# Patient Record
Sex: Female | Born: 1995 | Race: White | Hispanic: No | Marital: Married | Smoking: Never smoker
Health system: Southern US, Community
[De-identification: ages and names within clinical notes are randomized; demographics above are authoritative.]

## PROBLEM LIST (undated history)

## (undated) DIAGNOSIS — E039 Hypothyroidism, unspecified: Secondary | ICD-10-CM

## (undated) DIAGNOSIS — E119 Type 2 diabetes mellitus without complications: Secondary | ICD-10-CM

## (undated) DIAGNOSIS — E079 Disorder of thyroid, unspecified: Secondary | ICD-10-CM

## (undated) HISTORY — DX: Hypothyroidism, unspecified: E03.9

---

## 2019-12-12 ENCOUNTER — Emergency Department
Admission: EM | Admit: 2019-12-12 | Discharge: 2019-12-12 | Disposition: A | Discharge status: Home / Self Care | Payer: Federal, State, Local not specified - PPO | Attending: Emergency Medicine | Admitting: Emergency Medicine

## 2019-12-12 ENCOUNTER — Emergency Department: Payer: Federal, State, Local not specified - PPO

## 2019-12-12 ENCOUNTER — Other Ambulatory Visit: Payer: Self-pay

## 2019-12-12 DIAGNOSIS — O26899 Other specified pregnancy related conditions, unspecified trimester: Secondary | ICD-10-CM

## 2019-12-12 DIAGNOSIS — Z3A08 8 weeks gestation of pregnancy: Secondary | ICD-10-CM | POA: Insufficient documentation

## 2019-12-12 DIAGNOSIS — R109 Unspecified abdominal pain: Secondary | ICD-10-CM

## 2019-12-12 DIAGNOSIS — O039 Complete or unspecified spontaneous abortion without complication: Secondary | ICD-10-CM | POA: Diagnosis not present

## 2019-12-12 DIAGNOSIS — O24111 Pre-existing diabetes mellitus, type 2, in pregnancy, first trimester: Secondary | ICD-10-CM | POA: Insufficient documentation

## 2019-12-12 DIAGNOSIS — O4691 Antepartum hemorrhage, unspecified, first trimester: Secondary | ICD-10-CM | POA: Diagnosis present

## 2019-12-12 DIAGNOSIS — O209 Hemorrhage in early pregnancy, unspecified: Secondary | ICD-10-CM

## 2019-12-12 HISTORY — DX: Disorder of thyroid, unspecified: E07.9

## 2019-12-12 HISTORY — DX: Type 2 diabetes mellitus without complications: E11.9

## 2019-12-12 LAB — CBC
HCT: 38.2 % (ref 36.0–46.0)
Hemoglobin: 12.8 g/dL (ref 12.0–15.0)
MCH: 27 pg (ref 26.0–34.0)
MCHC: 33.5 g/dL (ref 30.0–36.0)
MCV: 80.6 fL (ref 80.0–100.0)
Platelets: 279 10*3/uL (ref 150–400)
RBC: 4.74 MIL/uL (ref 3.87–5.11)
RDW: 12.7 % (ref 11.5–15.5)
WBC: 10.7 10*3/uL — ABNORMAL HIGH (ref 4.0–10.5)
nRBC: 0 % (ref 0.0–0.2)

## 2019-12-12 LAB — BASIC METABOLIC PANEL
Anion gap: 14 (ref 5–15)
BUN: 12 mg/dL (ref 6–20)
CO2: 18 mmol/L — ABNORMAL LOW (ref 22–32)
Calcium: 9.1 mg/dL (ref 8.9–10.3)
Chloride: 104 mmol/L (ref 98–111)
Creatinine, Ser: 0.6 mg/dL (ref 0.44–1.00)
GFR calc Af Amer: >60 mL/min (ref >60)
GFR calc non Af Amer: >60 mL/min (ref >60)
Glucose, Bld: 187 mg/dL — ABNORMAL HIGH (ref 70–99)
Potassium: 3.8 mmol/L (ref 3.5–5.1)
Sodium: 136 mmol/L (ref 135–145)

## 2019-12-12 LAB — HCG, QUANTITATIVE, PREGNANCY: hCG, Beta Chain, Quant, S: 1164 m[IU]/mL — ABNORMAL HIGH (ref <5)

## 2019-12-12 MED ORDER — ONDANSETRON HCL 4 MG/2ML IJ SOLN
4.0000 mg | Freq: Once | INTRAMUSCULAR | Status: AC
  Administered 2019-12-12: 08:00:00 4 mg via INTRAVENOUS
  Filled 2019-12-12: qty 2

## 2019-12-12 MED ORDER — HYDROMORPHONE HCL 1 MG/ML IJ SOLN
1.0000 mg | Freq: Once | INTRAMUSCULAR | Status: AC
  Administered 2019-12-12: 0.5 mg via INTRAVENOUS
  Filled 2019-12-12: qty 1

## 2019-12-12 MED ORDER — FENTANYL CITRATE (PF) 100 MCG/2ML IJ SOLN
50.0000 ug | INTRAMUSCULAR | Status: AC | PRN
  Administered 2019-12-12 (×2): 50 ug via INTRAVENOUS
  Filled 2019-12-12 (×2): qty 2

## 2019-12-12 NOTE — ED Triage Notes (Signed)
FIRST NURSE NOTE:  Pt reports having miscarriage, states she is having a lot of pain, pt given mask on arrival, but won't put on.

## 2019-12-12 NOTE — ED Notes (Signed)
States pain comes in waves. Pt appears comfortable when not experiencing "waves" of pain. Denies more PRN pain medication at this time. Updated on plan of care and awaiting bed and EDP assessment. Wheeled to bathroom and provided more overnight pads for changing.

## 2019-12-12 NOTE — ED Triage Notes (Signed)
Pt reports confirmed miscarriage, approx 9 weeks. Began having constant bleeding this AM, increased pain. Pt took aleeve and tylenol this AM. "waves" of pain.

## 2019-12-12 NOTE — ED Provider Notes (Signed)
[facility] Emergency Department Provider Note  Time seen: 10:40 AM  I have reviewed the triage vital signs and the nursing notes.   HISTORY  Chief Complaint Miscarriage   HPI Caitlyn Garza is a 24 y.o. female with a past medical history of diabetes presents to the emergency department for a miscarriage in progress.  According to the patient approximately 5 days ago she was seen at [facility] emergency department and diagnosed with a miscarriage with an 8-week fetus without a heartbeat.  Patient was given options as far as surgical or expectant management, chose expectant management.  Patient states she began having some spotting bleeding around that time as well 5 days ago however this morning the bleeding increased and her cramping increased in intensity.  Patient states moderate lower abdominal pain/cramping.  Patient states she did not know it was going to be so uncomfortable/painful so she presented back to the emergency department.  Denies any lightheadedness dizziness.  Largely negative review of systems.   Past Medical History:  Diagnosis Date  . Diabetes mellitus without complication (HCC)   . Thyroid disease     There are no problems to display for this patient.   History reviewed. No pertinent surgical history.  Prior to Admission medications   Not on File    Allergies  Allergen Reactions  . Codeine Nausea And Vomiting  . Penicillins Nausea And Vomiting  . Amoxicillin     No family history on file.  Social History Social History   Tobacco Use  . Smoking status: Not on file  Substance Use Topics  . Alcohol use: Not Currently  . Drug use: Not on file    Review of Systems Constitutional: Negative for fever Cardiovascular: Negative for chest pain. Respiratory: Negative for shortness of breath. Gastrointestinal: Lower abdominal pain/cramping. Genitourinary: Positive for vaginal bleeding. Musculoskeletal: Negative for  musculoskeletal complaints Neurological: Negative for headache All other ROS negative  ____________________________________________   PHYSICAL EXAM:  VITAL SIGNS: ED Triage Vitals [12/12/19 0743]  Enc Vitals Group     BP 131/81     Pulse Rate 90     Resp 18     Temp 98 F (36.7 C)     Temp Source Oral     SpO2 100 %     Weight 250 lb (113.4 kg)     Height 5\' 6"  (1.676 m)     Head Circumference      Peak Flow      Pain Score 7     Pain Loc      Pain Edu?      Excl. in GC?    Constitutional: Alert and oriented. Well appearing and in no distress. Eyes: Normal exam ENT      Head: Normocephalic and atraumatic.      Mouth/Throat: Mucous membranes are moist. Cardiovascular: Normal rate, regular rhythm.  Respiratory: Normal respiratory effort without tachypnea nor retractions. Breath sounds are clear Gastrointestinal: Soft, mild suprapubic tenderness palpation without rebound guarding or distention. Musculoskeletal: Nontender with normal range of motion in all extremities.  Neurologic:  Normal speech and language. No gross focal neurologic deficits  Skin:  Skin is warm, dry and intact.  Psychiatric: Mood and affect are normal.   ____________________________________________    RADIOLOGY  Ultrasound shows no findings within the uterus.   ____________________________________________   INITIAL IMPRESSION / ASSESSMENT AND PLAN / ED COURSE  Pertinent labs & imaging results that were available during my care of the patient were reviewed  by me and considered in my medical decision making (see chart for details).   Patient presents emergency department for lower abdominal discomfort vaginal bleeding with a likely miscarriage in progress.  We will dose pain medication, lab work is largely nonrevealing largely unchanged H&H.  We will obtain an ultrasound to further evaluate.  Patient agreeable to plan of care.  Ultrasound shows essentially an empty uterus.  Patient states just  prior to the ultrasound she urinated and felt something come out of her into the toilet.  Patient states immediately after that she felt significant relief.  States her pain has been mild ever since then consistent with more menstrual cramping.  Patient is ultrasound appears to show a completed miscarriage.  I discussed with the patient return precautions for any return of significant pain or significant bleeding.  Also discussed taking a pregnancy test in 7 days to ensure that it is negative and following up with her OB.  Patient agreeable to plan of care.   Caitlyn Garza was evaluated in Emergency Department on 12/12/2019 for the symptoms described in the history of present illness. She was evaluated in the context of the global COVID-19 pandemic, which necessitated consideration that the patient might be at risk for infection with the SARS-CoV-2 virus that causes COVID-19. Institutional protocols and algorithms that pertain to the evaluation of patients at risk for COVID-19 are in a state of rapid change based on information released by regulatory bodies including the CDC and federal and state organizations. These policies and algorithms were followed during the patient's care in the ED.  ____________________________________________   FINAL CLINICAL IMPRESSION(S) / ED DIAGNOSES  Miscarriage   Harvest Dark, MD 12/12/19 1219

## 2020-03-17 IMAGING — US US OB < 14 WEEKS - US OB TV
1 series · 13 of 28 positions shown · non-contrast
Comparison: None.

CLINICAL DATA: Vaginal bleeding and cramping for 6 days, beta HCG
is elevated and last menstrual period of 06/14/2020 would give
gestational age by LMP of 13 weeks 4 days.

EXAM:
OBSTETRIC <14 WK US AND TRANSVAGINAL OB US
TECHNIQUE: Both transabdominal and transvaginal ultrasound examinations were
performed for complete evaluation of the gestation as well as the
maternal uterus, adnexal regions, and pelvic cul-de-sac.
Transvaginal technique was performed to assess early pregnancy.

[Series 1: us ob < 14 weeks - us ob tv · 13 of 133 slices shown]
[im 5/133]
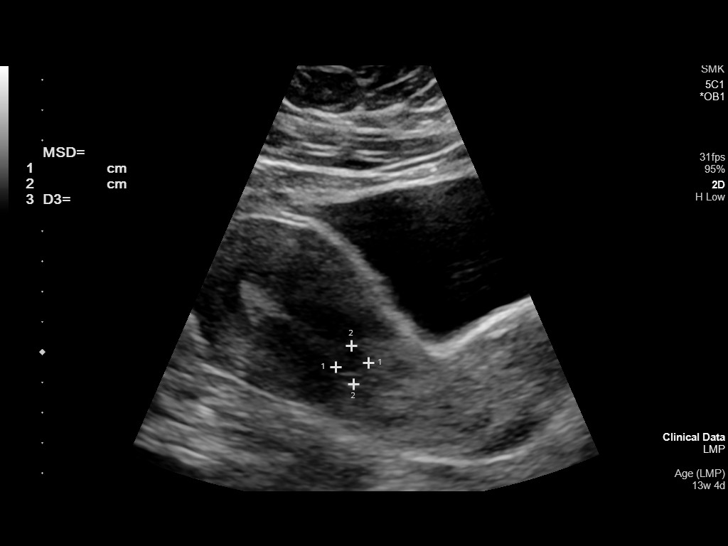
[im 15/133]
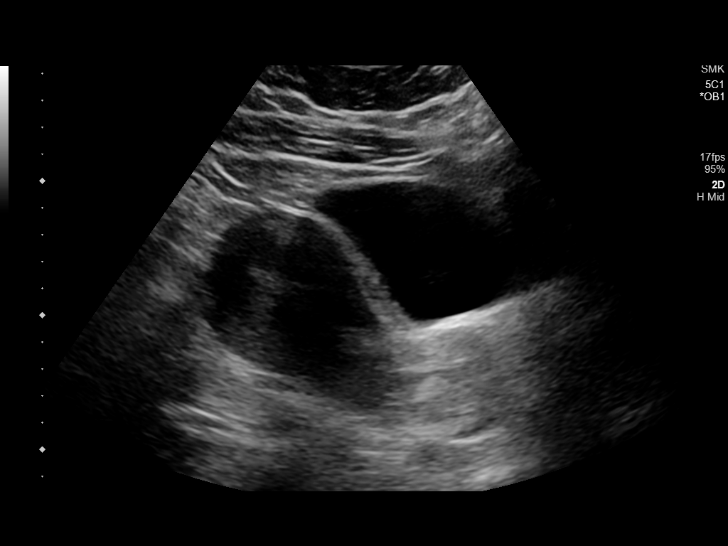
[im 25/133]
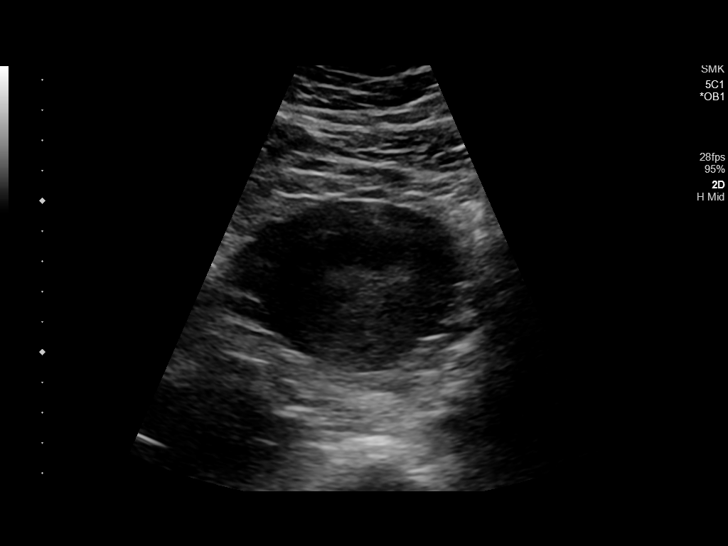
[im 35/133]
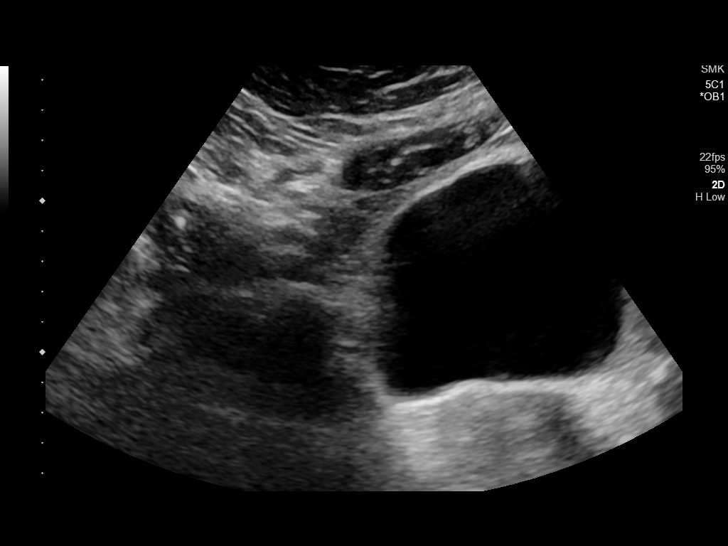
[im 45/133]
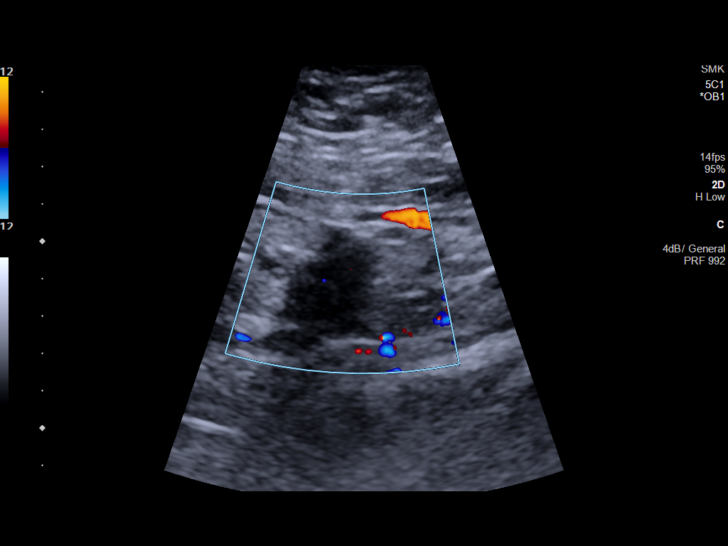
[im 54/133]
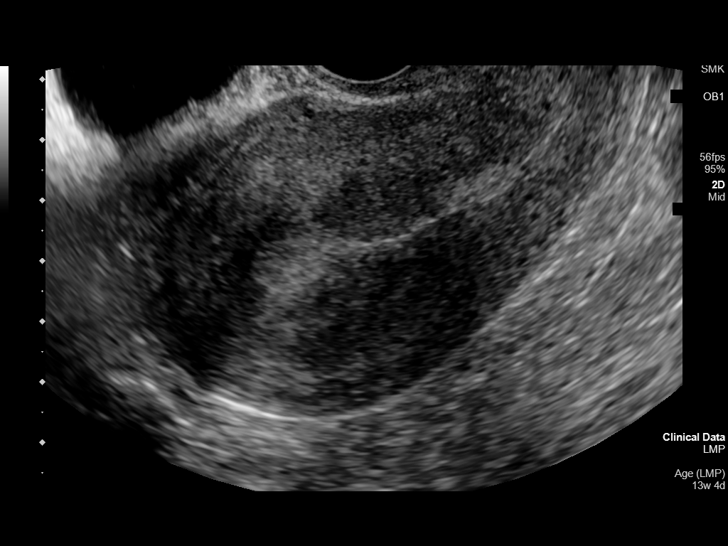
[im 69/133]
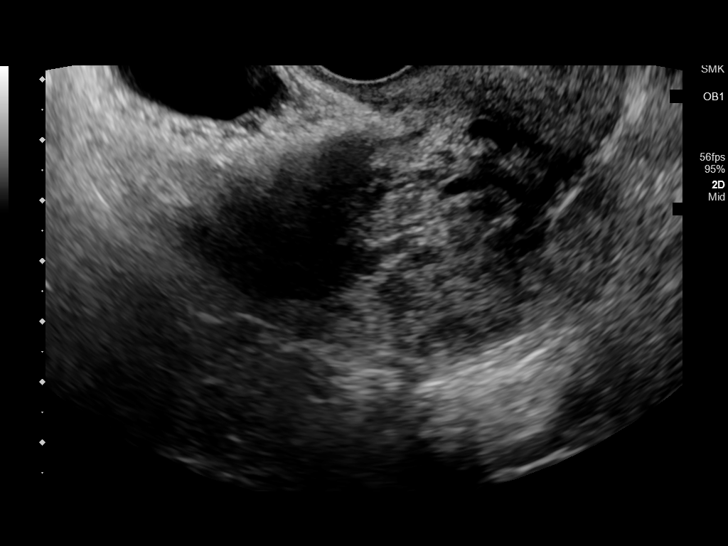
[im 79/133]
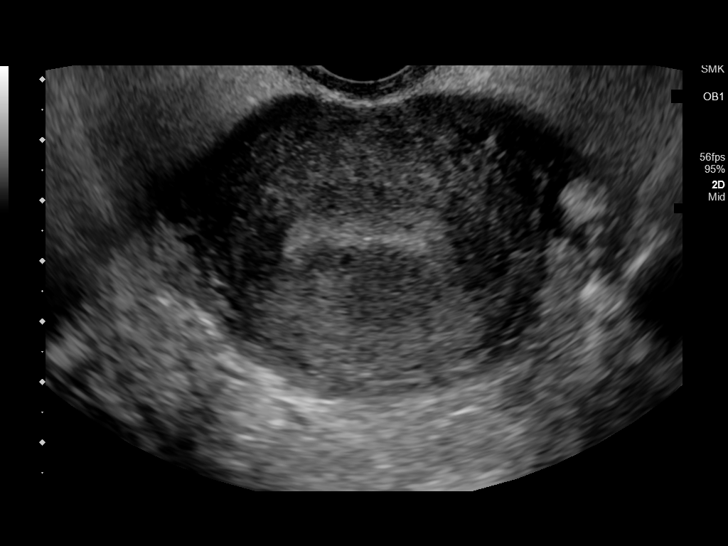
[im 89/133]
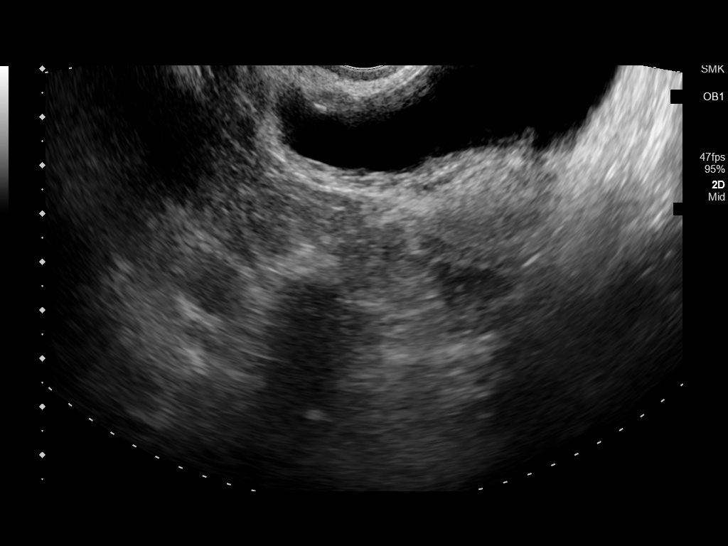
[im 98/133]
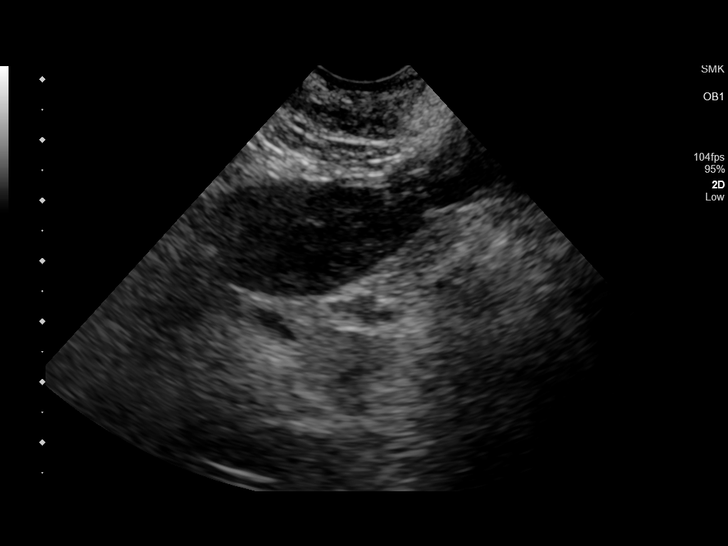
[im 108/133]
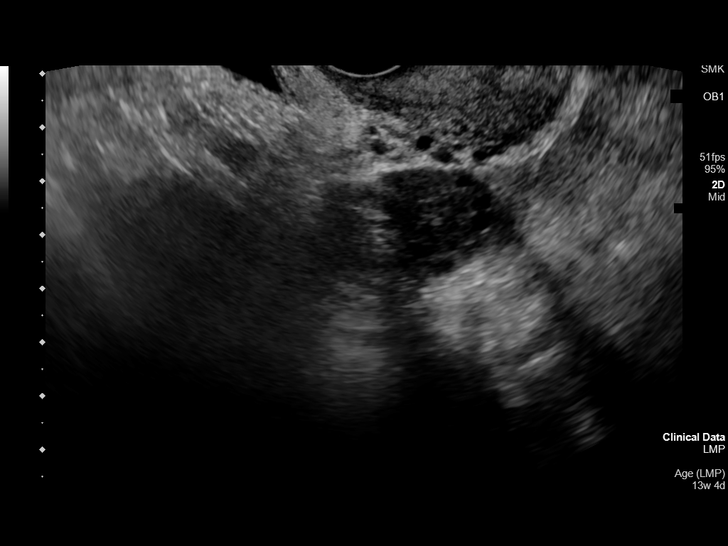
[im 118/133]
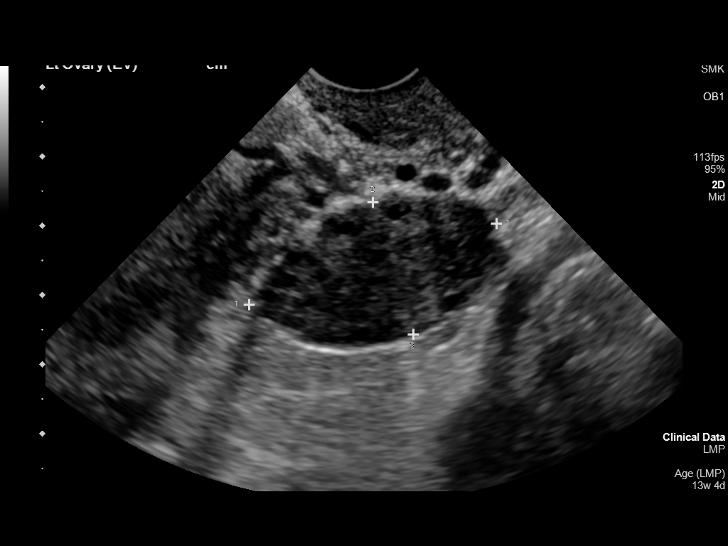
[im 128/133]
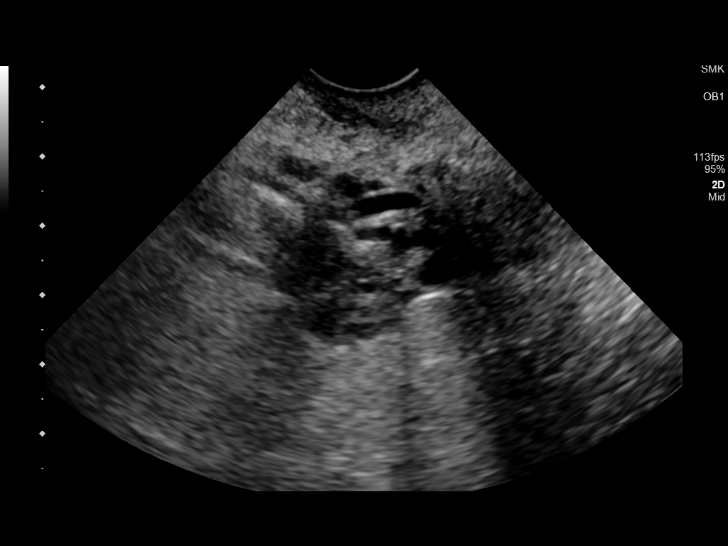

[13 of 28 positions shown; findings below may reference images not displayed]

FINDINGS: Intrauterine gestational sac: Questionable area visualized on
transabdominal imaging, none seen on endovaginal portion of the
evaluation in the area of concern noted on transabdominal images.
According to the sonographer the patient reported passing material
when going to empty bladder before endovaginal ultrasound. This area
measured approximately 11.5 mm on the transabdominal portion but is
again not seen on the endovaginal portion of the exam.

Yolk sac:  Not visualized

Embryo:  Not visualized

Maternal uterus/adnexae: Findings above with discordant findings of
subtle area on transabdominal without correlate on endovaginal
sonogram within the lower uterine segment. Thickening of the
endometrium.

Normal appearance of bilateral ovaries.

Small free fluid with simple appearance in the cul-de-sac.
IMPRESSION: Pregnancy of unknown anatomic location (no intrauterine gestational
sac or adnexal mass identified). Differential diagnosis includes
recent spontaneous miscarriage, IUP too early to visualize, and
non-visualized ectopic pregnancy. Recommend correlation with serial
beta-hCG levels, and follow up US.

See above for questionable passage of material during the
examination. Despite this would still recommend serial beta hCGs and
follow-up ultrasound for further assessment.

## 2020-07-20 ENCOUNTER — Emergency Department (HOSPITAL_COMMUNITY): Payer: Federal, State, Local not specified - PPO

## 2020-07-20 ENCOUNTER — Encounter (HOSPITAL_COMMUNITY): Payer: Self-pay | Admitting: Emergency Medicine

## 2020-07-20 ENCOUNTER — Emergency Department (HOSPITAL_COMMUNITY)
Admission: EM | Admit: 2020-07-20 | Discharge: 2020-07-20 | Disposition: A | Discharge status: Home / Self Care | Payer: Federal, State, Local not specified - PPO | Attending: Emergency Medicine | Admitting: Emergency Medicine

## 2020-07-20 ENCOUNTER — Other Ambulatory Visit: Payer: Self-pay

## 2020-07-20 DIAGNOSIS — Z7982 Long term (current) use of aspirin: Secondary | ICD-10-CM | POA: Insufficient documentation

## 2020-07-20 DIAGNOSIS — R000 Tachycardia, unspecified: Secondary | ICD-10-CM | POA: Insufficient documentation

## 2020-07-20 DIAGNOSIS — E1165 Type 2 diabetes mellitus with hyperglycemia: Secondary | ICD-10-CM | POA: Diagnosis not present

## 2020-07-20 DIAGNOSIS — Z79899 Other long term (current) drug therapy: Secondary | ICD-10-CM | POA: Diagnosis not present

## 2020-07-20 DIAGNOSIS — Z20822 Contact with and (suspected) exposure to covid-19: Secondary | ICD-10-CM | POA: Diagnosis not present

## 2020-07-20 DIAGNOSIS — R079 Chest pain, unspecified: Secondary | ICD-10-CM | POA: Insufficient documentation

## 2020-07-20 DIAGNOSIS — R059 Cough, unspecified: Secondary | ICD-10-CM | POA: Insufficient documentation

## 2020-07-20 DIAGNOSIS — J0100 Acute maxillary sinusitis, unspecified: Secondary | ICD-10-CM | POA: Diagnosis not present

## 2020-07-20 DIAGNOSIS — Z7984 Long term (current) use of oral hypoglycemic drugs: Secondary | ICD-10-CM | POA: Diagnosis not present

## 2020-07-20 DIAGNOSIS — E039 Hypothyroidism, unspecified: Secondary | ICD-10-CM | POA: Insufficient documentation

## 2020-07-20 DIAGNOSIS — R42 Dizziness and giddiness: Secondary | ICD-10-CM | POA: Diagnosis not present

## 2020-07-20 DIAGNOSIS — Z794 Long term (current) use of insulin: Secondary | ICD-10-CM | POA: Insufficient documentation

## 2020-07-20 DIAGNOSIS — R739 Hyperglycemia, unspecified: Secondary | ICD-10-CM

## 2020-07-20 LAB — I-STAT VENOUS BLOOD GAS, ED
Acid-Base Excess: 0 mmol/L (ref 0.0–2.0)
Bicarbonate: 26.1 mmol/L (ref 20.0–28.0)
Calcium, Ion: 1.16 mmol/L (ref 1.15–1.40)
HCT: 40 % (ref 36.0–46.0)
Hemoglobin: 13.6 g/dL (ref 12.0–15.0)
O2 Saturation: 98 %
Potassium: 4 mmol/L (ref 3.5–5.1)
Sodium: 136 mmol/L (ref 135–145)
TCO2: 28 mmol/L (ref 22–32)
pCO2, Ven: 48.3 mmHg (ref 44.0–60.0)
pH, Ven: 7.34 (ref 7.250–7.430)
pO2, Ven: 106 mmHg — ABNORMAL HIGH (ref 32.0–45.0)

## 2020-07-20 LAB — BASIC METABOLIC PANEL
Anion gap: 16 — ABNORMAL HIGH (ref 5–15)
BUN: 16 mg/dL (ref 6–20)
CO2: 22 mmol/L (ref 22–32)
Calcium: 9.7 mg/dL (ref 8.9–10.3)
Chloride: 97 mmol/L — ABNORMAL LOW (ref 98–111)
Creatinine, Ser: 0.81 mg/dL (ref 0.44–1.00)
GFR calc non Af Amer: >60 mL/min (ref >60)
Glucose, Bld: 534 mg/dL (ref 70–99)
Potassium: 4.1 mmol/L (ref 3.5–5.1)
Sodium: 135 mmol/L (ref 135–145)

## 2020-07-20 LAB — CBC
HCT: 46.3 % — ABNORMAL HIGH (ref 36.0–46.0)
Hemoglobin: 15.1 g/dL — ABNORMAL HIGH (ref 12.0–15.0)
MCH: 25.9 pg — ABNORMAL LOW (ref 26.0–34.0)
MCHC: 32.6 g/dL (ref 30.0–36.0)
MCV: 79.6 fL — ABNORMAL LOW (ref 80.0–100.0)
Platelets: 369 10*3/uL (ref 150–400)
RBC: 5.82 MIL/uL — ABNORMAL HIGH (ref 3.87–5.11)
RDW: 12.6 % (ref 11.5–15.5)
WBC: 8.9 10*3/uL (ref 4.0–10.5)
nRBC: 0 % (ref 0.0–0.2)

## 2020-07-20 LAB — I-STAT BETA HCG BLOOD, ED (MC, WL, AP ONLY): I-stat hCG, quantitative: <5 m[IU]/mL (ref <5)

## 2020-07-20 LAB — URINALYSIS, ROUTINE W REFLEX MICROSCOPIC
Bilirubin Urine: NEGATIVE
Glucose, UA: >=500 mg/dL — AB
Hgb urine dipstick: NEGATIVE
Ketones, ur: NEGATIVE mg/dL
Leukocytes,Ua: NEGATIVE
Nitrite: NEGATIVE
Protein, ur: NEGATIVE mg/dL
Specific Gravity, Urine: 1.04 — ABNORMAL HIGH (ref 1.005–1.030)
pH: 5 (ref 5.0–8.0)

## 2020-07-20 LAB — CBG MONITORING, ED
Glucose-Capillary: 547 mg/dL (ref 70–99)
Glucose-Capillary: 448 mg/dL — ABNORMAL HIGH (ref 70–99)
Glucose-Capillary: 380 mg/dL — ABNORMAL HIGH (ref 70–99)
Glucose-Capillary: 278 mg/dL — ABNORMAL HIGH (ref 70–99)

## 2020-07-20 LAB — RESPIRATORY PANEL BY RT PCR (FLU A&B, COVID)
Influenza A by PCR: NEGATIVE
Influenza B by PCR: NEGATIVE
SARS Coronavirus 2 by RT PCR: NEGATIVE

## 2020-07-20 LAB — D-DIMER, QUANTITATIVE: D-Dimer, Quant: 0.53 ug/mL-FEU — ABNORMAL HIGH (ref 0.00–0.50)

## 2020-07-20 LAB — TROPONIN I (HIGH SENSITIVITY)
Troponin I (High Sensitivity): 3 ng/L (ref <18)
Troponin I (High Sensitivity): 5 ng/L (ref <18)

## 2020-07-20 MED ORDER — INSULIN ASPART 100 UNIT/ML ~~LOC~~ SOLN
10.0000 [IU] | Freq: Once | SUBCUTANEOUS | Status: AC
  Administered 2020-07-20: 10 [IU] via SUBCUTANEOUS

## 2020-07-20 MED ORDER — KETOROLAC TROMETHAMINE 30 MG/ML IJ SOLN
30.0000 mg | Freq: Once | INTRAMUSCULAR | Status: AC
  Administered 2020-07-20: 30 mg via INTRAVENOUS
  Filled 2020-07-20: qty 1

## 2020-07-20 MED ORDER — INSULIN ASPART 100 UNIT/ML ~~LOC~~ SOLN
15.0000 [IU] | Freq: Once | SUBCUTANEOUS | Status: AC
  Administered 2020-07-20: 15 [IU] via SUBCUTANEOUS

## 2020-07-20 MED ORDER — SODIUM CHLORIDE 0.9 % IV BOLUS
2000.0000 mL | Freq: Once | INTRAVENOUS | Status: AC
  Administered 2020-07-20: 2000 mL via INTRAVENOUS

## 2020-07-20 MED ORDER — IOHEXOL 350 MG/ML SOLN
100.0000 mL | Freq: Once | INTRAVENOUS | Status: AC | PRN
  Administered 2020-07-20: 62 mL via INTRAVENOUS

## 2020-07-20 MED ORDER — LORAZEPAM 2 MG/ML IJ SOLN
1.0000 mg | Freq: Once | INTRAMUSCULAR | Status: AC
  Administered 2020-07-20: 1 mg via INTRAVENOUS
  Filled 2020-07-20: qty 1

## 2020-07-20 MED ORDER — AZITHROMYCIN 250 MG PO TABS: 250.0000 mg | ORAL_TABLET | Freq: Every day | ORAL | 0 refills | Status: DC

## 2020-07-20 MED ORDER — ONDANSETRON 4 MG PO TBDP: 4.0000 mg | ORAL_TABLET | Freq: Three times a day (TID) | ORAL | 0 refills | Status: DC | PRN

## 2020-07-20 NOTE — ED Notes (Signed)
Pt transported to CT ?

## 2020-07-20 NOTE — ED Triage Notes (Addendum)
Pt arrives to ED with complaints of palpitations, dizziness, and elevated blood sugars starting this morning while at work. Pt states her HR got up to 180 and blood sugar was 475. Pt states shes been on methylprednisolone for about a week for a sinus infection. Pt states she got hot, dizzy, and nauseous. Hx diabetes and hypothyroidism.

## 2020-07-20 NOTE — ED Provider Notes (Addendum)
[facility] Provider Note   CSN: [id number] Arrival date & time: 07/20/20  [id number]    History Chief Complaint  Patient presents with  . Hyperglycemia  . Tachycardia    Caitlyn Billy FischerShetley is a 24 y.o. female with past medical history significant for diabetes, hypothyroidism who presents for evaluation of not feeling well.  Patient recently diagnosed with diabetes 3 years ago at the age of 24.  Was not being followed by PCP up until a few months ago.  Patient states her last A1c was 11.  Patient is on insulin as well as Metformin.  Has not been checking her blood sugars.  Did not take her insulin today.  Did take yesterday evening.  Patient states she has been having palpitations.  These were sudden onset.  Associated chest tightness. No prior history of PE, DVT, On BC.  No unilateral leg swelling, redness around her has bilateral lower extremity cramping.  She states her PCP told her she was not sure if she was a type II or a type I diabetic.  Patient did take 2 days of Depo-Medrol due to rhinorrhea and sinus congestion which she started on Monday.  Started feeling unwell after taking 2 days worth so she stopped taking the steroids.  Has had some cough.  States she had a negative PCR test for Covid on Monday.  She is vaccinated.  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  Will occasionally have some burning with urination however no hematuria.  LMP in September.  She is sexually active with her husband.  Denies vaginal discharge, pelvic pain or concerns for STDs.  No associated abdominal pain.  Denies current chest pain however states it feels like her heart is racing.  Has had some lightheadedness and dizziness.  No sudden onset thunderclap headache.  No double vision, visual field cuts.  No neck stiffness or neck rigidity.  Denies additional aggravating or alleviating factors.  History obtained from patient  and past medical records. No interpretor was used.  HPI     Past Medical History:  Diagnosis Date  . Diabetes mellitus without complication (HCC)   . Thyroid disease     There are no problems to display for this patient.   History reviewed. No pertinent surgical history.   OB History    Gravida  1   Para      Term      Preterm      AB      Living        SAB      TAB      Ectopic      Multiple      Live Births              History reviewed. No pertinent family history.  Social History   Tobacco Use  . Smoking status: Not on file  Substance Use Topics  . Alcohol use: Not Currently  . Drug use: Not on file    Home Medications Prior to Admission medications   Medication Sig Start Date End Date Taking? Authorizing Provider  aspirin EC 81 MG tablet Take 81 mg by mouth once. Swallow whole.   Yes [provider]  insulin aspart (NOVOLOG) 100 UNIT/ML FlexPen Inject 30 Units into the skin 3 (three) times daily with meals. 05/25/20  Yes [provider]  insulin degludec (TRESIBA FLEXTOUCH) 200 UNIT/ML FlexTouch Pen  Inject 100 Units into the skin daily. 05/25/20  Yes [provider]  levothyroxine (SYNTHROID) 150 MCG tablet Take 150 mcg by mouth daily. 07/06/20  Yes [provider]  metFORMIN (GLUCOPHAGE) 500 MG tablet Take 500 mg by mouth 2 (two) times daily with a meal.  05/31/20 11/27/20 Yes [provider]  pantoprazole (PROTONIX) 40 MG tablet Take 1 tablet by mouth daily as needed for heartburn. 07/06/20  Yes [provider]  azithromycin (ZITHROMAX) 250 MG tablet Take 1 tablet (250 mg total) by mouth daily. Take first 2 tablets together, then 1 every day until finished. 07/20/20   Carmel Waddington A, PA-C  ondansetron (ZOFRAN ODT) 4 MG disintegrating tablet Take 1 tablet (4 mg total) by mouth every 8 (eight) hours as needed for nausea or vomiting. 07/20/20   Lleyton Byers A, PA-C    Allergies    Codeine,  Penicillins, Amoxicillin, Hydromorphone, and Morphine  Review of Systems   Review of Systems  Constitutional: Positive for activity change, appetite change and fatigue. Negative for chills, diaphoresis, fever and unexpected weight change.  HENT: Positive for congestion, postnasal drip and rhinorrhea. Negative for sinus pressure, sinus pain, sore throat, trouble swallowing and voice change.   Respiratory: Positive for cough and shortness of breath. Negative for apnea, choking, chest tightness, wheezing and stridor.   Cardiovascular: Positive for palpitations. Negative for chest pain and leg swelling.  Gastrointestinal: Negative.   Genitourinary: Positive for dysuria. Negative for decreased urine volume, difficulty urinating, dyspareunia, flank pain, frequency, genital sores, hematuria, menstrual problem, pelvic pain, urgency, vaginal bleeding, vaginal discharge and vaginal pain.  Musculoskeletal: Negative.   Skin: Negative.   Neurological: Positive for weakness (Generalized) and light-headedness. Negative for dizziness, tremors, seizures, syncope, facial asymmetry, speech difficulty, numbness and headaches.  All other systems reviewed and are negative.   Physical Exam Updated Vital Signs BP 117/82   Pulse 74   Temp 98.7 F (37.1 C) (Oral)   Resp 20   Ht 5\' 5"  (1.651 m)   Wt 111.1 kg   LMP 09/08/2019 Comment: NEG HCG 07/20/20  SpO2 98%   BMI 40.77 kg/m   Physical Exam Vitals and nursing note reviewed.  Constitutional:      General: She is not in acute distress.    Appearance: She is well-developed. She is obese. She is not ill-appearing or toxic-appearing.  HENT:     Head: Normocephalic and atraumatic.     Nose: Congestion and rhinorrhea present.     Mouth/Throat:     Mouth: Mucous membranes are moist.  Eyes:     Pupils: Pupils are equal, round, and reactive to light.  Cardiovascular:     Rate and Rhythm: Tachycardia present.     Pulses: Normal pulses.     Heart sounds:  Normal heart sounds.  Pulmonary:     Effort: Pulmonary effort is normal. No respiratory distress.     Breath sounds: Normal breath sounds.     Comments: Speaks in full sentences without difficulty. Lungs clear to auscultation bilaterally. Abdominal:     General: Bowel sounds are normal. There is no distension.     Tenderness: There is no abdominal tenderness. There is no right CVA tenderness, left CVA tenderness or guarding.     Comments: Soft non tender without rebound or guarding  Musculoskeletal:        General: No swelling, tenderness, deformity or signs of injury. Normal range of motion.     Cervical back: Normal range of motion.  Right lower leg: No edema.     Left lower leg: No edema.     Comments: Moves all 4 extremities without difficulty. No bony tenderness. Compartment soft. Homans sign negative  Skin:    General: Skin is warm and dry.     Capillary Refill: Capillary refill takes less than 2 seconds.     Comments: No edema, erythema, warmth.  Neurological:     General: No focal deficit present.     Mental Status: She is alert and oriented to person, place, and time.     Comments: CN 2-12 grossly intact. Ambulatory without difficulty  Psychiatric:     Comments: Appear anxious    ED Results / Procedures / Treatments   Labs (all labs ordered are listed, but only abnormal results are displayed) Labs Reviewed  BASIC METABOLIC PANEL - Abnormal; Notable for the following components:      Result Value   Chloride 97 (*)    Glucose, Bld 534 (*)    Anion gap 16 (*)    All other components within normal limits  CBC - Abnormal; Notable for the following components:   RBC 5.82 (*)    Hemoglobin 15.1 (*)    HCT 46.3 (*)    MCV 79.6 (*)    MCH 25.9 (*)    All other components within normal limits  D-DIMER, QUANTITATIVE (NOT AT [facility]) - Abnormal; Notable for the following components:   D-Dimer, Quant 0.53 (*)    All other components within normal limits  URINALYSIS, ROUTINE  W REFLEX MICROSCOPIC - Abnormal; Notable for the following components:   Specific Gravity, Urine 1.040 (*)    Glucose, UA >=500 (*)    Bacteria, UA RARE (*)    All other components within normal limits  CBG MONITORING, ED - Abnormal; Notable for the following components:   Glucose-Capillary 547 (*)    All other components within normal limits  CBG MONITORING, ED - Abnormal; Notable for the following components:   Glucose-Capillary 448 (*)    All other components within normal limits  I-STAT VENOUS BLOOD GAS, ED - Abnormal; Notable for the following components:   pO2, Ven 106.0 (*)    All other components within normal limits  CBG MONITORING, ED - Abnormal; Notable for the following components:   Glucose-Capillary 380 (*)    All other components within normal limits  CBG MONITORING, ED - Abnormal; Notable for the following components:   Glucose-Capillary 278 (*)    All other components within normal limits  RESPIRATORY PANEL BY RT PCR (FLU A&B, COVID)  I-STAT BETA HCG BLOOD, ED ([facility], [facility], [facility] ONLY)  TROPONIN I (HIGH SENSITIVITY)  TROPONIN I (HIGH SENSITIVITY)    EKG None  Radiology DG Chest 2 View  Result Date: 07/20/2020 CLINICAL DATA:  SOB / CURRENT SINUS INFECTION / HEAVY CHEST FEELING / ELEVATED HEART RATE EXAM: CHEST - 2 VIEW COMPARISON:  Chest radiograph 05/24/2020 FINDINGS: The cardiomediastinal contours are within normal limits. The lungs are clear. No pneumothorax or pleural effusion. No acute finding in the visualized skeleton. IMPRESSION: No acute cardiopulmonary process. Electronically Signed   By: Emmaline Kluver M.D.   On: 07/20/2020 09:54   CT Angio Chest PE W and/or Wo Contrast  Result Date: 07/20/2020 CLINICAL DATA:  Palpitations and dizziness.  Elevated D-dimer. EXAM: CT ANGIOGRAPHY CHEST WITH CONTRAST TECHNIQUE: Multidetector CT imaging of the chest was performed using the standard protocol during bolus administration of intravenous contrast. Multiplanar CT image  reconstructions and MIPs  were obtained to evaluate the vascular anatomy. CONTRAST:  60mL OMNIPAQUE IOHEXOL 350 MG/ML SOLN COMPARISON:  Chest x-ray from same day. FINDINGS: Cardiovascular: Satisfactory opacification of the pulmonary arteries to the segmental level. No evidence of pulmonary embolism. Normal heart size. No pericardial effusion. No thoracic aortic aneurysm. 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 No enlarged mediastinal, hilar, or axillary lymph nodes. Thyroid gland, trachea, and esophagus demonstrate no significant findings. Lungs/Pleura: Lungs are clear. No pleural effusion or pneumothorax. Upper Abdomen: No acute abnormality.  Diffuse hepatic steatosis. Musculoskeletal: No chest wall abnormality. No acute or significant osseous findings. Review of the MIP images confirms the above findings. IMPRESSION: 1. No evidence of pulmonary embolism. No acute intrathoracic process. 2. Hepatic steatosis. Electronically Signed   By: Obie Dredge M.D.   On: 07/20/2020 12:52    Procedures Procedures (including critical care time)  Medications Ordered in ED Medications  sodium chloride 0.9 % bolus 2,000 mL (2,000 mLs Intravenous New Bag/Given 07/20/20 0959)  insulin aspart (novoLOG) injection 10 Units (10 Units Subcutaneous Given 07/20/20 1001)  insulin aspart (novoLOG) injection 10 Units (10 Units Subcutaneous Given 07/20/20 1138)  LORazepam (ATIVAN) injection 1 mg (1 mg Intravenous Given 07/20/20 1200)  iohexol (OMNIPAQUE) 350 MG/ML injection 100 mL (62 mLs Intravenous Contrast Given 07/20/20 1242)  insulin aspart (novoLOG) injection 15 Units (15 Units Subcutaneous Given 07/20/20 1348)  ketorolac (TORADOL) 30 MG/ML injection 30 mg (30 mg Intravenous Given 07/20/20 1403)   ED Course  I have reviewed the triage vital signs and the nursing notes.  Pertinent labs & imaging results that were available during my care of the patient were reviewed by me and considered in my medical  decision making (see chart for details).  24 year old presents for evaluation of feeling unwell, with palpitations and tachycardia. Afebrile, non septic, non ill appearing. Tachy in room with sinus tachycardia. Lungs clear. Clear rhinorrhea, abd soft, non tender, Admits to BLE cramping however no clinical evidence of DVT on exam. Recently started on DepoMedrol for sinusitis. negative COVID outpatient. Plan on labs, imaging and reassess.  Labs and imaging personally reviewed and interpreted:  CBC without leukocytosis, Hgb 15.1 likley hemoconcentration 2/2 dehydration BMP Hyperglycemia at 534, anion gap at 16, no additional renal, liver or electrolyte abnormality I-stat hcg negative D-dimer 0.53, CTA added Trop 3>>5 UA glucosurea, no ketonuria COVID/ Flu negative DG chest without acute infiltrates, cardiomegaly, pulmonary edema, pneumothorax EKG with sinus tachycardia, No STEMI VBG without acidosis, normal bicarb CTA chest without PE, infectious process  Patient reassessed. HR improves however still mildly tachycardic. Does appear anxious. Repeat cbg at 448, additional insulin added. Still getting IVF.  Patient reassessed.  Admits to some nasal congestion and front sinus HA.  Requesting medication for this.  Discussed with patient stopping the steroids given this is likely what caused her increased blood sugar. Toradol given here in ED.  Nonfocal neuro exam without deficits.  Low suspicion for acute intracranial abnormality, venous sinus thrombosis, abscess.  No arrhythmia while here in ED.  Patient reassessed.  Tolerating p.o. intake.  CBG downtrending.  Low suspicion for DKA or HHS.  Patient with nonfocal neuro exam without deficits.  Patient is requesting antibiotics given length of time of nasal congestion rhinorrhea.  Allergic to penicillins.  Will give azithromycin.  Discussed medications at home for symptomatic management.  She will follow-up with her PCP.  Patient's hyperglycemia likely  the cause of her lightheadedness earlier today.  Her tachycardia has improved with fluids and blood  sugar management.  Have low suspicion for acute PE, ACS, dissection, pneumothorax. CBG recheck at 278  The patient has been appropriately medically screened and/or stabilized in the ED. I have low suspicion for any other emergent medical condition which would require further screening, evaluation or treatment in the ED or require inpatient management.  Patient is hemodynamically stable and in no acute distress.  Patient able to ambulate in department prior to ED.  Evaluation does not show acute pathology that would require ongoing or additional emergent interventions while in the emergency department or further inpatient treatment.  I have discussed the diagnosis with the patient and answered all questions.  Pain is been managed while in the emergency department and patient has no further complaints prior to discharge.  Patient is comfortable with plan discussed in room and is stable for discharge at this time.  I have discussed strict return precautions for returning to the emergency department.  Patient was encouraged to follow-up with PCP/specialist refer to at discharge.  MDM Rules/Calculators/A&P                          Wynema Verdone was evaluated in Emergency Department on 07/20/2020 for the symptoms described in the history of present illness. She was evaluated in the context of the global COVID-19 pandemic, which necessitated consideration that the patient might be at risk for infection with the SARS-CoV-2 virus that causes COVID-19. Institutional protocols and algorithms that pertain to the evaluation of patients at risk for COVID-19 are in a state of rapid change based on information released by regulatory bodies including the CDC and federal and state organizations. These policies and algorithms were followed during the patient's care in the ED. Final Clinical Impression(s) / ED Diagnoses Final  diagnoses:  Hyperglycemia  Tachycardia  Acute non-recurrent maxillary sinusitis    Rx / DC Orders ED Discharge Orders         Ordered    azithromycin (ZITHROMAX) 250 MG tablet  Daily        07/20/20 1500    ondansetron (ZOFRAN ODT) 4 MG disintegrating tablet  Every 8 hours PRN        07/20/20 1500           Miangel Flom A, PA-C 07/20/20 1501    Breslin Burklow A, PA-C 07/20/20 1502    Margarita Grizzle, MD 07/21/20 1129

## 2020-07-20 NOTE — Discharge Instructions (Signed)
Take the antibiotics.  Drink plenty fluids at home  Keep close check your blood sugars.

## 2020-10-24 IMAGING — DX DG CHEST 2V
2 series · 2 of 2 positions shown · non-contrast
Comparison: Chest radiograph 05/24/2020

CLINICAL DATA: SOB / CURRENT SINUS INFECTION / HEAVY CHEST FEELING
/ ELEVATED HEART RATE

EXAM:
CHEST - 2 VIEW

[chest pa]
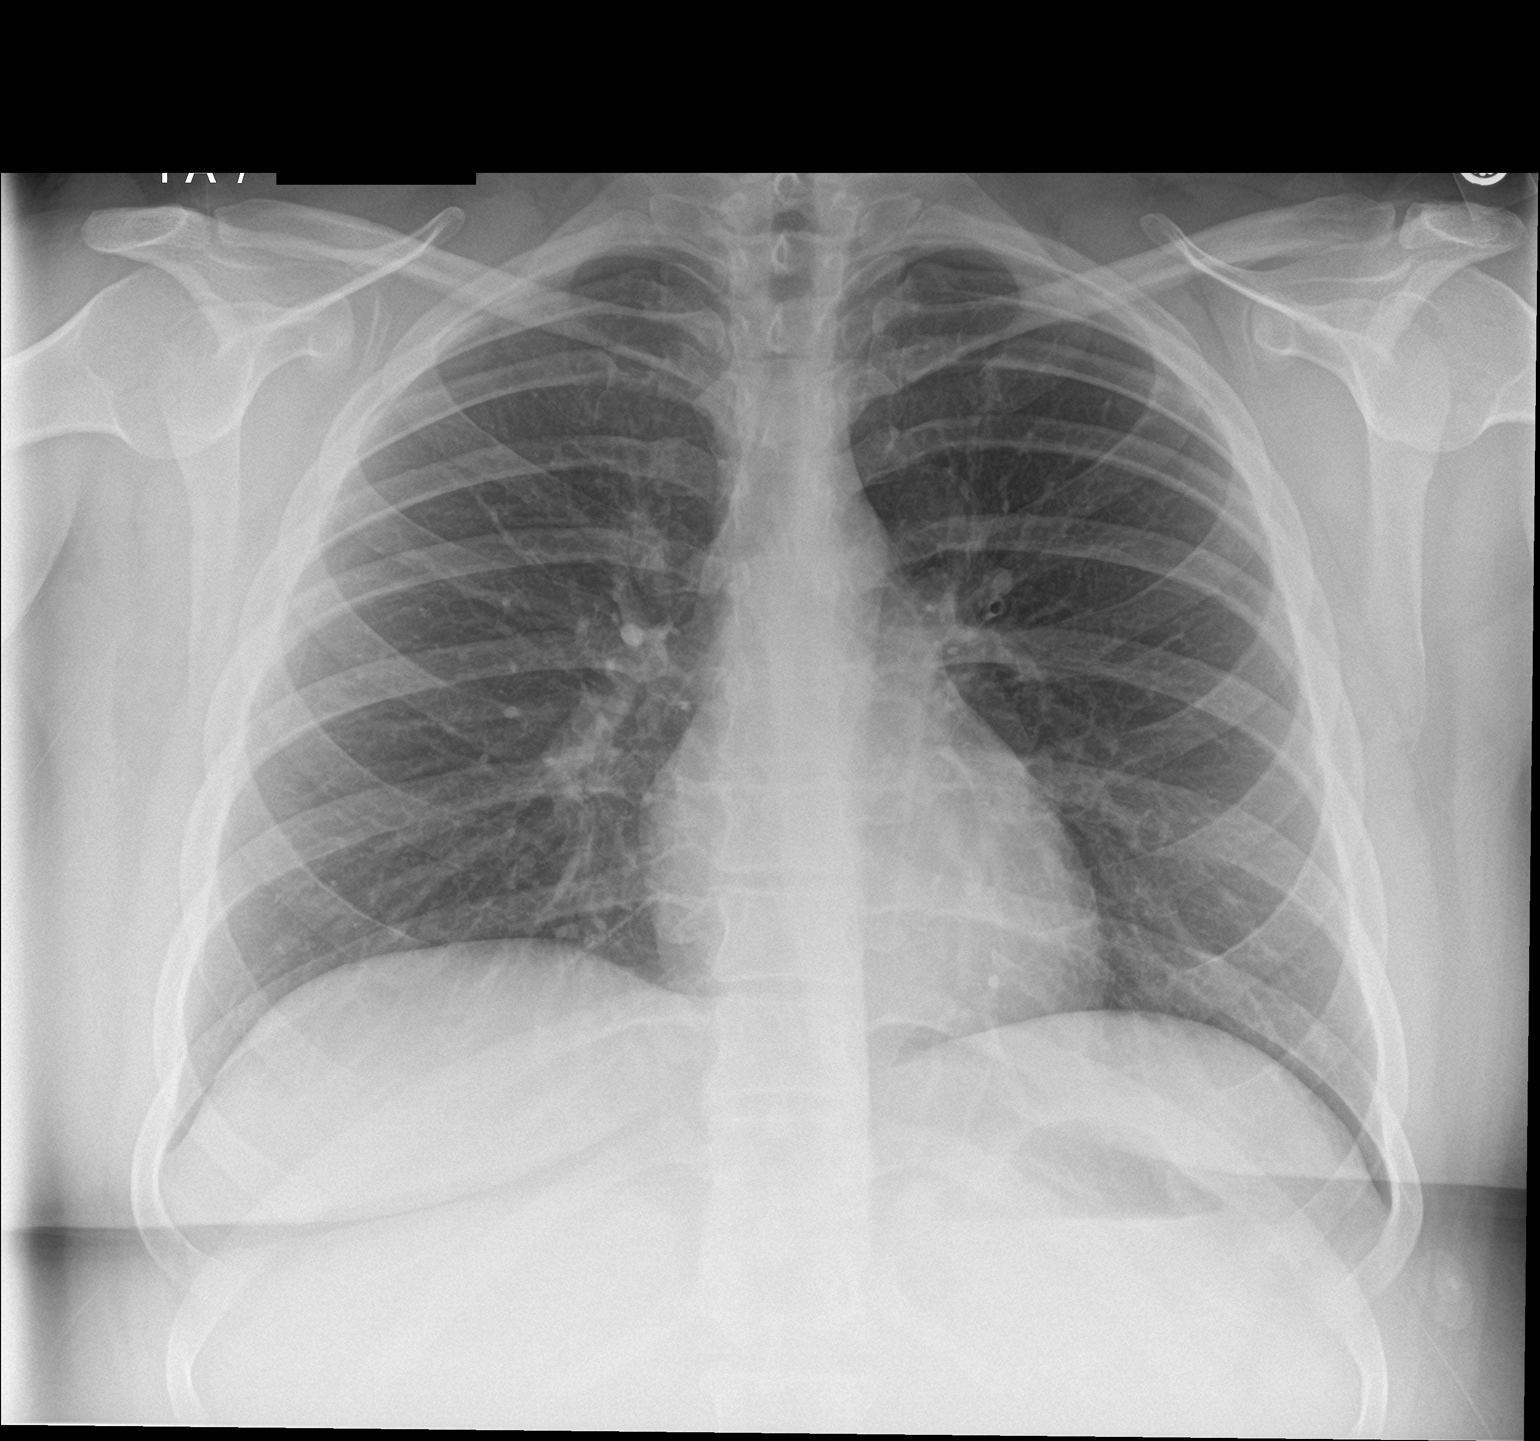

[chest lat]
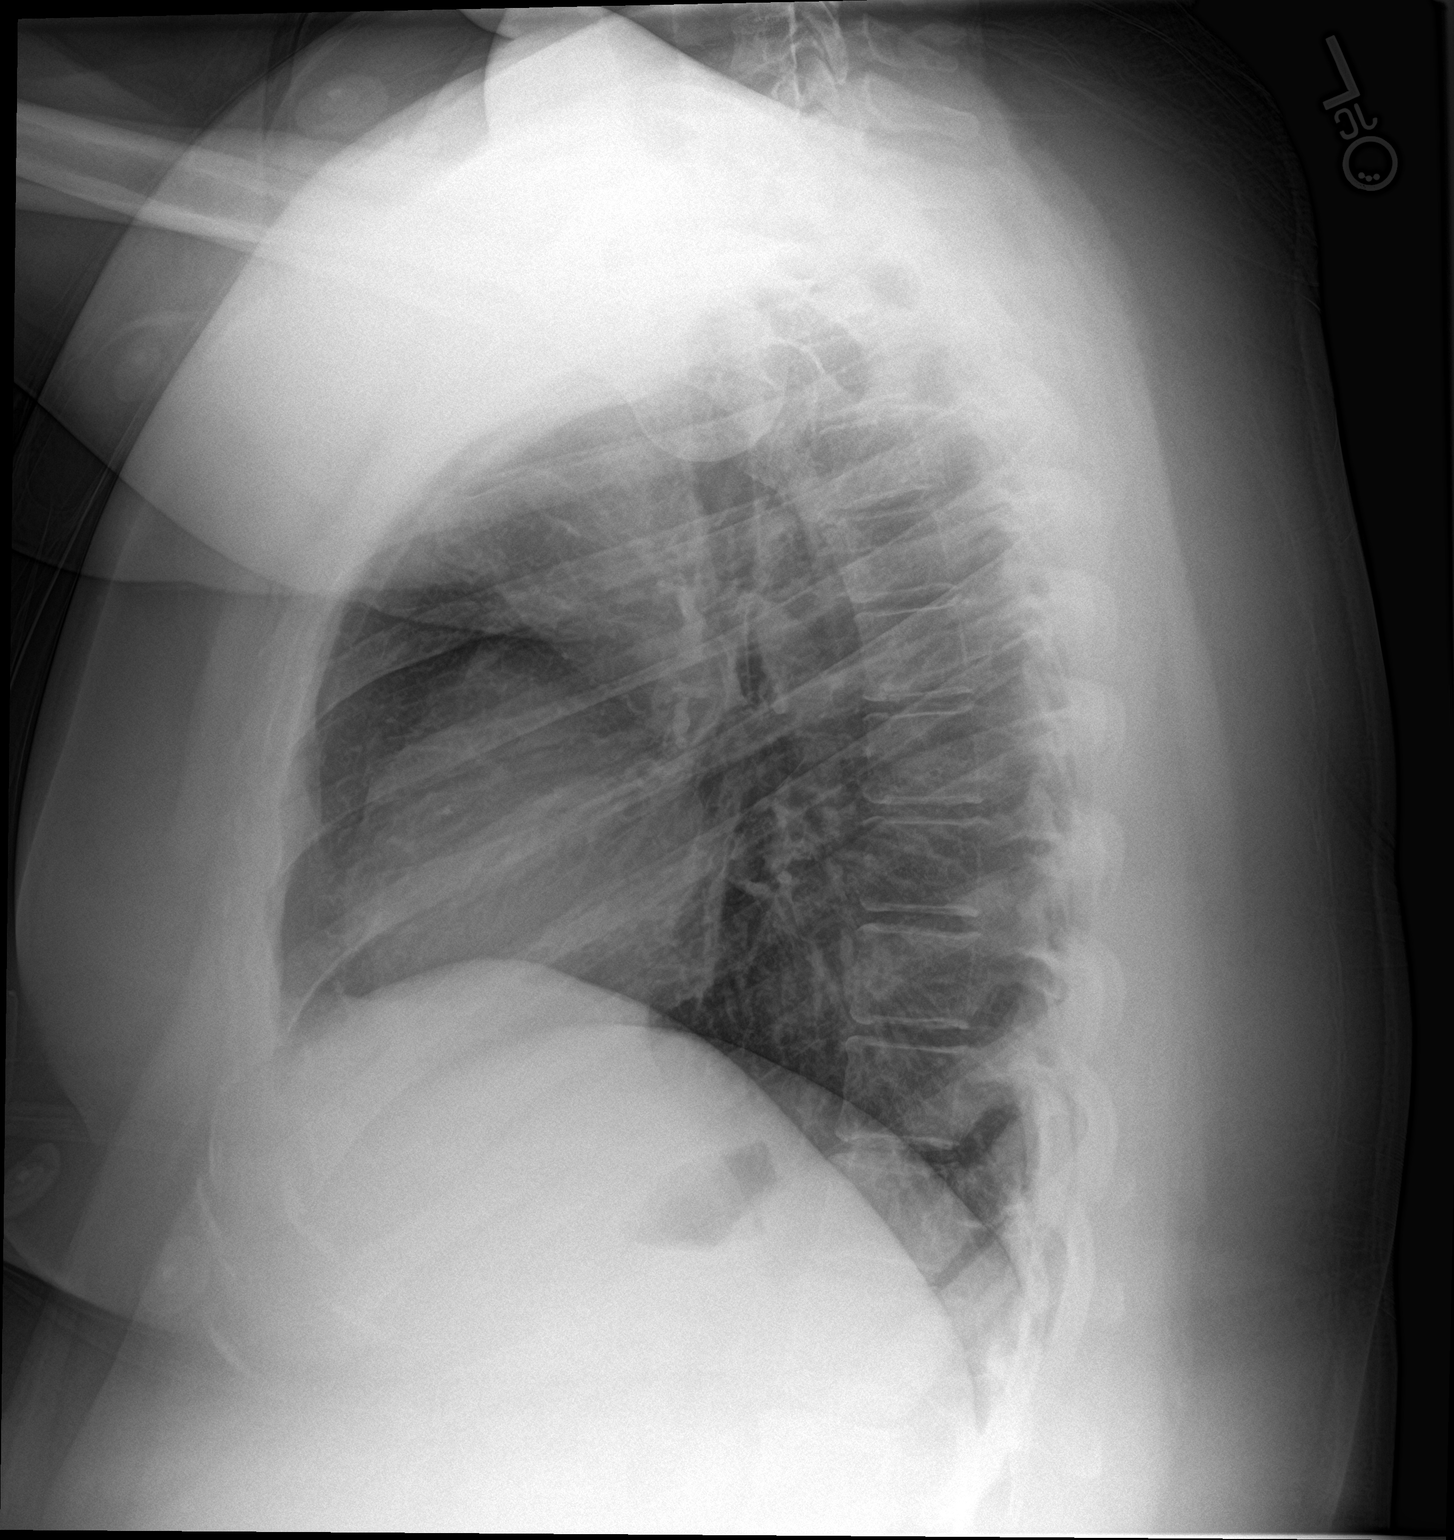

[2 of 2 positions shown; findings below may reference images not displayed]

FINDINGS: The cardiomediastinal contours are within normal limits. The lungs
are clear. No pneumothorax or pleural effusion. No acute finding in
the visualized skeleton.
IMPRESSION: No acute cardiopulmonary process.

## 2023-04-08 ENCOUNTER — Encounter: Payer: Self-pay | Admitting: Allergy

## 2023-04-08 ENCOUNTER — Ambulatory Visit (INDEPENDENT_AMBULATORY_CARE_PROVIDER_SITE_OTHER): Payer: Medicaid Other | Admitting: Allergy

## 2023-04-08 VITALS — BP 124/72 | HR 90 | Resp 16 | Ht 66.0 in | Wt 236.0 lb

## 2023-04-08 DIAGNOSIS — R110 Nausea: Secondary | ICD-10-CM

## 2023-04-08 DIAGNOSIS — R1084 Generalized abdominal pain: Secondary | ICD-10-CM

## 2023-04-08 DIAGNOSIS — R1110 Vomiting, unspecified: Secondary | ICD-10-CM

## 2023-04-08 MED ORDER — EPINEPHRINE 0.3 MG/0.3ML IJ SOAJ: 0.3000 mg | INTRAMUSCULAR | 1 refills | Status: AC | PRN

## 2023-04-08 NOTE — Progress Notes (Signed)
New Patient Note  RE: Caitlyn Garza MRN: [id number] DOB: 1996-05-26 Date of Office Visit: 04/08/2023   Primary care provider: Pcp, No  Chief Complaint: issues after eating  History of present illness: Caitlyn Garza is a 27 y.o. female presenting today for evaluation of food intolerance.   She states since having children her stomach has gotten sensitive for foods.  She states these issues have been a 'constant battle' since 2022. She is not sure what foods are causing her issues.  She states she develops nausea, vomiting and abdominal pain after eating.  She states these symptoms typically occur in the evening after she has eaten everything for the day.  By 9pm she can have these symptoms.  She has been use zofran essentially daily.  She states she has a fear of vomiting and feels like she is going to pass off.  She states she may vomit couple times a month that it gets to that point but nauseous daily. She states she eats a lot of chicken and dairy.  She is diabetic and is on a health journey.  She is not on metformin.  She was on insulin with both of her pregnancies.  She is following with endocrinology.  She states what prompted her going to see endocrine prior to diabetes diagnosis she states she was nauseous often. She states even as a child she can recall having similar symptoms.  She states she was tested for type 1 DM and it was inconclusive. With eating citrus and products with citrus she states she can break out and have a red and hot area; she has noted this with face wash with citrus.  Citrus fruits also cause throat scratchiness.  She denies cutaneous, respiratory or CV related symptoms after eating. No history of food allergy, asthma, eczema or seasonal/perennial allergic rhinoconjunctivitis.  Review of systems: Review of Systems  Constitutional: Negative.   HENT: Negative.    Eyes: Negative.   Respiratory: Negative.    Cardiovascular: Negative.   Gastrointestinal:         See HPI  Musculoskeletal: Negative.   Skin: Negative.   Allergic/Immunologic: Negative.   Neurological: Negative.     All other systems negative unless noted above in HPI  Past medical history: Past Medical History:  Diagnosis Date   Diabetes mellitus without complication (HCC)    Thyroid disease     Past surgical history: History reviewed. No pertinent surgical history.  Family history:  History reviewed. No pertinent family history.  Social history: Lives in a home without carpeting with electric heating and central cooling.  Dog in the home.  Cat and dog outside the home.  There is no concern for water damage, mildew or roaches in the home.  She is a Futures trader.  She denies a smoking history.   Medication List: Current Outpatient Medications  Medication Sig Dispense Refill   EPINEPHrine 0.3 mg/0.3 mL IJ SOAJ injection Inject 0.3 mg into the muscle as needed for anaphylaxis. As needed for life-threatening allergic reactions 2 each 1   metFORMIN (GLUCOPHAGE) 500 MG tablet Take 500 mg by mouth 2 (two) times daily with a meal.      No current facility-administered medications for this visit.    Known medication allergies: Allergies  Allergen Reactions   Codeine Nausea And Vomiting   Penicillins Nausea And Vomiting   Amoxicillin Nausea And Vomiting   Hydromorphone Other (See Comments)    Muscle pain and stiffness Muscle pain and stiffness  Morphine Other (See Comments)    Muscle pain and stiffness Muscle pain and stiffness      Physical examination: Blood pressure 124/72, pulse 90, resp. rate 16, height 5\' 6"  (1.676 m), weight 236 lb (107 kg), SpO2 97 %, unknown if currently breastfeeding.  General: Alert, interactive, in no acute distress. HEENT: PERRLA, TMs pearly gray, turbinates non-edematous without discharge, post-pharynx non erythematous. Neck: Supple without lymphadenopathy. Lungs: Clear to auscultation without wheezing, rhonchi or rales. {no  increased work of breathing. CV: Normal S1, S2 without murmurs. Abdomen: Nondistended, nontender. Skin: Warm and dry, without lesions or rashes. Extremities:  No clubbing, cyanosis or edema. Neuro:   Grossly intact.  Diagnositics/Labs:  Allergy testing:   Food Adult Perc - 04/08/23 0900     Time Antigen Placed 0940    Allergen Manufacturer Waynette Buttery    Location Back    Number of allergen test 38     Control-buffer 50% Glycerol Negative    Control-Histamine 2+    3. Wheat Negative    5. Milk, Cow Negative    6. Casein Negative    7. Egg White, Chicken Negative    8. Shellfish Mix Negative    9. Fish Mix Negative    18. Trout Negative    19. Tuna 2+    20. Salmon Negative    21. Flounder Negative    22. Codfish 2+    23. Shrimp Negative    24. Crab Negative    25. Lobster Negative    26. Oyster Negative    27. Scallops Negative    28. Oat  Negative    29. Rice Negative    30. Barley Negative    31. Rye  Negative    33. [place] Meat Negative    34. Chicken Meat Negative    35. Pork Negative    36. Beef Negative    45. Green Pepper Negative    47. Onion Negative    54. Grape (White seedless) Negative    55. Orange  Negative    56. Lemon Negative    57. Banana Negative    58. Apple Negative    60. Strawberry Negative    65. Pineapple Negative    66. Chocolate/Cacao Bean Negative    70. Garlic Negative    71. Pepper, Black Negative             Allergy testing results were read and interpreted by provider, documented by clinical staff.   Assessment and plan: Postprandial pain, nausea and vomiting   - We have discussed the following in regards to foods:   Allergy: food allergy is when you have eaten a food, developed an allergic reaction after eating the food and have IgE to the food (positive food testing either by skin testing or blood testing).  Food allergy could lead to life threatening symptoms  Sensitivity: occurs when you have IgE to a food (positive  food testing either by skin testing or blood testing) but is a food you eat without any issues.  This is not an allergy and we recommend keeping the food in the diet  Intolerance: this is when you have negative testing by either skin testing or blood testing thus not allergic but the food causes symptoms (like belly pain, bloating, diarrhea etc) with ingestion.  These foods should be avoided to prevent symptoms.     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   - Recommend avoidance of finned fish in  diet - Have access to self-injectable epinephrine (Epipen or AuviQ) 0.3mg  at all times - Follow emergency action plan in case of allergic reaction - Will obtain alpha gal panel as well (red meat allergy test by bloodwork. Will call you when results return). -You may also have lactose intolerance and would recommend using lactose-free milk and see if you have symptoms after ingestion.  If not then would need to lactose-free products in your diet.  You can also take lactase enzyme tablets prior to eating known dairy products to help prevent symptoms.  Follow-up in 6 months or sooner if needed I appreciate the opportunity to take part in Angeleena's care. Please do not hesitate to contact me with questions.  Sincerely,   Margo Aye, MD Allergy/Immunology [facility]

## 2023-04-08 NOTE — Patient Instructions (Addendum)
-   We have discussed the following in regards to foods:   Allergy: food allergy is when you have eaten a food, developed an allergic reaction after eating the food and have IgE to the food (positive food testing either by skin testing or blood testing).  Food allergy could lead to life threatening symptoms  Sensitivity: occurs when you have IgE to a food (positive food testing either by skin testing or blood testing) but is a food you eat without any issues.  This is not an allergy and we recommend keeping the food in the diet  Intolerance: this is when you have negative testing by either skin testing or blood testing thus not allergic but the food causes symptoms (like belly pain, bloating, diarrhea etc) with ingestion.  These foods should be avoided to prevent symptoms.     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   - Recommend avoidance of finned fish in diet - Have access to self-injectable epinephrine (Epipen or AuviQ) 0.3mg  at all times - Follow emergency action plan in case of allergic reaction - Will obtain alpha gal panel as well (red meat allergy test by bloodwork. Will call you when results return). -You may also have lactose intolerance and would recommend using lactose-free milk and see if you have symptoms after ingestion.  If not then would need to lactose-free products in your diet.  You can also take lactase enzyme tablets prior to eating known dairy products to help prevent symptoms.  Follow-up in 6 months or sooner if needed

## 2023-04-30 ENCOUNTER — Encounter (HOSPITAL_COMMUNITY): Payer: Self-pay

## 2023-04-30 ENCOUNTER — Other Ambulatory Visit: Payer: Self-pay

## 2023-04-30 ENCOUNTER — Emergency Department (HOSPITAL_COMMUNITY)
Admission: EM | Admit: 2023-04-30 | Discharge: 2023-05-01 | Disposition: A | Discharge status: Home / Self Care | Payer: Medicaid Other | Attending: Emergency Medicine | Admitting: Emergency Medicine

## 2023-04-30 DIAGNOSIS — R160 Hepatomegaly, not elsewhere classified: Secondary | ICD-10-CM | POA: Diagnosis not present

## 2023-04-30 DIAGNOSIS — R197 Diarrhea, unspecified: Secondary | ICD-10-CM | POA: Insufficient documentation

## 2023-04-30 DIAGNOSIS — R1031 Right lower quadrant pain: Secondary | ICD-10-CM | POA: Diagnosis not present

## 2023-04-30 DIAGNOSIS — R109 Unspecified abdominal pain: Secondary | ICD-10-CM | POA: Diagnosis present

## 2023-04-30 DIAGNOSIS — K760 Fatty (change of) liver, not elsewhere classified: Secondary | ICD-10-CM | POA: Diagnosis not present

## 2023-04-30 DIAGNOSIS — R110 Nausea: Secondary | ICD-10-CM | POA: Insufficient documentation

## 2023-04-30 LAB — COMPREHENSIVE METABOLIC PANEL
ALT: 32 U/L (ref 0–44)
AST: 18 U/L (ref 15–41)
Albumin: 4.1 g/dL (ref 3.5–5.0)
Alkaline Phosphatase: 45 U/L (ref 38–126)
Anion gap: 10 (ref 5–15)
BUN: 11 mg/dL (ref 6–20)
CO2: 24 mmol/L (ref 22–32)
Calcium: 9.7 mg/dL (ref 8.9–10.3)
Chloride: 101 mmol/L (ref 98–111)
Creatinine, Ser: 0.76 mg/dL (ref 0.44–1.00)
GFR, Estimated: >60 mL/min (ref >60)
Glucose, Bld: 307 mg/dL — ABNORMAL HIGH (ref 70–99)
Potassium: 4.1 mmol/L (ref 3.5–5.1)
Sodium: 135 mmol/L (ref 135–145)
Total Bilirubin: 0.6 mg/dL (ref 0.3–1.2)
Total Protein: 7.5 g/dL (ref 6.5–8.1)

## 2023-04-30 LAB — CBC WITH DIFFERENTIAL/PLATELET
Abs Immature Granulocytes: 0.07 10*3/uL (ref 0.00–0.07)
Basophils Absolute: 0.1 10*3/uL (ref 0.0–0.1)
Basophils Relative: 1 %
Eosinophils Absolute: 0.4 10*3/uL (ref 0.0–0.5)
Eosinophils Relative: 4 %
HCT: 43.8 % (ref 36.0–46.0)
Hemoglobin: 14.6 g/dL (ref 12.0–15.0)
Immature Granulocytes: 1 %
Lymphocytes Relative: 28 %
Lymphs Abs: 2.8 10*3/uL (ref 0.7–4.0)
MCH: 25.7 pg — ABNORMAL LOW (ref 26.0–34.0)
MCHC: 33.3 g/dL (ref 30.0–36.0)
MCV: 77.1 fL — ABNORMAL LOW (ref 80.0–100.0)
Monocytes Absolute: 0.4 10*3/uL (ref 0.1–1.0)
Monocytes Relative: 4 %
Neutro Abs: 6.2 10*3/uL (ref 1.7–7.7)
Neutrophils Relative %: 62 %
Platelets: 399 10*3/uL (ref 150–400)
RBC: 5.68 MIL/uL — ABNORMAL HIGH (ref 3.87–5.11)
RDW: 12.9 % (ref 11.5–15.5)
WBC: 10.1 10*3/uL (ref 4.0–10.5)
nRBC: 0 % (ref 0.0–0.2)

## 2023-04-30 LAB — LIPASE, BLOOD: Lipase: 39 U/L (ref 11–51)

## 2023-04-30 MED ORDER — ONDANSETRON 4 MG PO TBDP
4.0000 mg | ORAL_TABLET | Freq: Once | ORAL | Status: AC
  Administered 2023-04-30: 4 mg via ORAL
  Filled 2023-04-30: qty 1

## 2023-04-30 NOTE — ED Provider Triage Note (Signed)
Emergency Medicine Provider Triage Evaluation Note  Caitlyn Garza , a 27 y.o. female  was evaluated in triage.  Pt complains of generalized abdominal pain that spread to the right side.  The pain started yesterday and was accompanied with some feelings of nausea and non-bloody diarrhea.  No fevers or chills.  She has history of previous tubal ligation. Also notes some vaginal spotting this morning.   Review of Systems  Positive: As above Negative: As above  Physical Exam  BP (!) 137/109 (BP Location: Right Arm)   Pulse 99   Temp 98.2 F (36.8 C) (Oral)   Resp (!) 22   Ht 5\' 5"  (1.651 m)   Wt 105.2 kg   SpO2 97%   BMI 38.61 kg/m  Gen:   Awake, no distress   Resp:  Normal effort  MSK:   Moves extremities without difficulty  Other:  Mild tenderness to palpation in the epigastric and right lower quadrant  Regular rate and rhythm  Medical Decision Making  Medically screening exam initiated at 9:37 PM.  Appropriate orders placed.  Alexes Rempel was informed that the remainder of the evaluation will be completed by another provider, this initial triage assessment does not replace that evaluation, and the importance of remaining in the ED until their evaluation is complete.     Arabella Merles, PA-C 04/30/23 2153

## 2023-04-30 NOTE — ED Triage Notes (Signed)
Pt reports laparoscopic surgery to have tubes removed 2 weeks ago. No reported issues with surgery at the time. Pt reports N/D/right lower abdominal pain x1-2 days. Pt reports 3 loose stools this morning. Pt also reports pain in right flank when urinating. Movement worsens pain. Denies vomiting after eating/drinking. Denies fever/SOB/weakness/fatigue.

## 2023-05-01 ENCOUNTER — Emergency Department (HOSPITAL_COMMUNITY): Payer: Medicaid Other

## 2023-05-01 LAB — URINALYSIS, ROUTINE W REFLEX MICROSCOPIC
Bacteria, UA: NONE SEEN
Bilirubin Urine: NEGATIVE
Glucose, UA: >=500 mg/dL — AB
Hgb urine dipstick: NEGATIVE
Ketones, ur: 5 mg/dL — AB
Leukocytes,Ua: NEGATIVE
Nitrite: NEGATIVE
Protein, ur: 30 mg/dL — AB
Specific Gravity, Urine: 1.042 — ABNORMAL HIGH (ref 1.005–1.030)
pH: 5 (ref 5.0–8.0)

## 2023-05-01 LAB — HCG, QUANTITATIVE, PREGNANCY: hCG, Beta Chain, Quant, S: 1 m[IU]/mL (ref <5)

## 2023-05-01 MED ORDER — IOHEXOL 350 MG/ML SOLN
75.0000 mL | Freq: Once | INTRAVENOUS | Status: AC | PRN
  Administered 2023-05-01: 75 mL via INTRAVENOUS

## 2023-05-01 NOTE — ED Provider Notes (Signed)
[facility] AT [facility] Provider Note   CSN: [id number] Arrival date & time: 04/30/23  2011     History  Chief Complaint  Patient presents with   Abdominal Pain    Caitlyn Garza is a 27 y.o. female.  Presents to the emergency department for evaluation of abdominal pain.  Patient initially with some generalized lower abdominal pain that then spread to the right side.  No right upper quadrant pain or tenderness but she does endorse nausea and has had some diarrhea.  Patient concerned because she underwent bilateral salpingectomy 2 weeks ago.       Home Medications Prior to Admission medications   Medication Sig Start Date End Date Taking? Authorizing Provider  EPINEPHrine 0.3 mg/0.3 mL IJ SOAJ injection Inject 0.3 mg into the muscle as needed for anaphylaxis. As needed for life-threatening allergic reactions 04/08/23  Yes Padgett, Pilar Grammes, MD  ondansetron (ZOFRAN) 4 MG tablet Take 4 mg by mouth every 8 (eight) hours as needed for nausea or vomiting.   Yes [provider]  metFORMIN (GLUCOPHAGE) 500 MG tablet Take 500 mg by mouth 2 (two) times daily with a meal.  05/31/20 11/27/20  [provider]      Allergies    Codeine, Penicillins, Amoxicillin, Hydromorphone, Morphine, and Reglan [metoclopramide]    Review of Systems   Review of Systems  Physical Exam Updated Vital Signs BP (!) 140/91 (BP Location: Right Arm)   Pulse 86   Temp 98.2 F (36.8 C) (Oral)   Resp (!) 26   Ht 5\' 5"  (1.651 m)   Wt 105.2 kg   SpO2 100%   BMI 38.61 kg/m  Physical Exam Vitals and nursing note reviewed.  Constitutional:      General: She is not in acute distress.    Appearance: She is well-developed.  HENT:     Head: Normocephalic and atraumatic.     Mouth/Throat:     Mouth: Mucous membranes are moist.  Eyes:     General: Vision grossly intact. Gaze aligned appropriately.     Extraocular Movements: Extraocular movements  intact.     Conjunctiva/sclera: Conjunctivae normal.  Cardiovascular:     Rate and Rhythm: Normal rate and regular rhythm.     Pulses: Normal pulses.     Heart sounds: Normal heart sounds, S1 normal and S2 normal. No murmur heard.    No friction rub. No gallop.  Pulmonary:     Effort: Pulmonary effort is normal. No respiratory distress.     Breath sounds: Normal breath sounds.  Abdominal:     General: Bowel sounds are normal.     Palpations: Abdomen is soft.     Tenderness: There is abdominal tenderness in the right lower quadrant and suprapubic area. There is no guarding or rebound.     Hernia: No hernia is present.  Musculoskeletal:        General: No swelling.     Cervical back: Full passive range of motion without pain, normal range of motion and neck supple. No spinous process tenderness or muscular tenderness. Normal range of motion.     Right lower leg: No edema.     Left lower leg: No edema.  Skin:    General: Skin is warm and dry.     Capillary Refill: Capillary refill takes less than 2 seconds.     Findings: No ecchymosis, erythema, rash or wound.  Neurological:     General: No focal deficit present.  Mental Status: She is alert and oriented to person, place, and time.     GCS: GCS eye subscore is 4. GCS verbal subscore is 5. GCS motor subscore is 6.     Cranial Nerves: Cranial nerves 2-12 are intact.     Sensory: Sensation is intact.     Motor: Motor function is intact.     Coordination: Coordination is intact.  Psychiatric:        Attention and Perception: Attention normal.        Mood and Affect: Mood normal.        Speech: Speech normal.        Behavior: Behavior normal.     ED Results / Procedures / Treatments   Labs (all labs ordered are listed, but only abnormal results are displayed) Labs Reviewed  CBC WITH DIFFERENTIAL/PLATELET - Abnormal; Notable for the following components:      Result Value   RBC 5.68 (*)    MCV 77.1 (*)    MCH 25.7 (*)     All other components within normal limits  COMPREHENSIVE METABOLIC PANEL - Abnormal; Notable for the following components:   Glucose, Bld 307 (*)    All other components within normal limits  URINALYSIS, ROUTINE W REFLEX MICROSCOPIC - Abnormal; Notable for the following components:   APPearance HAZY (*)    Specific Gravity, Urine 1.042 (*)    Glucose, UA >=500 (*)    Ketones, ur 5 (*)    Protein, ur 30 (*)    All other components within normal limits  LIPASE, BLOOD  HCG, QUANTITATIVE, PREGNANCY    EKG None  Radiology CT ABDOMEN PELVIS W CONTRAST  Result Date: 05/01/2023 CLINICAL DATA:  laparoscopic surgery to have tubes removed 2 weeks ago. No reported issues with surgery at the time. Pt reports N/D/right lower abdominal pain x1-2 days. EXAM: CT ABDOMEN AND PELVIS WITH CONTRAST TECHNIQUE: Multidetector CT imaging of the abdomen and pelvis was performed using the standard protocol following bolus administration of intravenous contrast. RADIATION DOSE REDUCTION: This exam was performed according to the departmental dose-optimization program which includes automated exposure control, adjustment of the mA and/or kV according to patient size and/or use of iterative reconstruction technique. CONTRAST:  75mL OMNIPAQUE IOHEXOL 350 MG/ML SOLN COMPARISON:  None Available. FINDINGS: Lower chest: No acute abnormality. Hepatobiliary: The liver is enlarged measuring up to 19 cm. The hepatic parenchyma is diffusely hypodense compared to the splenic parenchyma consistent with fatty infiltration. No focal liver abnormality. No gallstones, gallbladder wall thickening, or pericholecystic fluid. No biliary dilatation. Pancreas: No focal lesion. Normal pancreatic contour. No surrounding inflammatory changes. No main pancreatic ductal dilatation. Spleen: Splenule noted.  Normal in size without focal abnormality. Adrenals/Urinary Tract: No adrenal nodule bilaterally. Bilateral kidneys enhance symmetrically. No  hydronephrosis. No hydroureter. The urinary bladder is unremarkable. Stomach/Bowel: Stomach is within normal limits. No evidence of bowel wall thickening or dilatation. Appendix appears normal. Vascular/Lymphatic: No abdominal aorta or iliac aneurysm. No abdominal, pelvic, or inguinal lymphadenopathy. Reproductive: Uterus and bilateral adnexa are unremarkable. Bilateral ovaries are unremarkable. Other: No intraperitoneal free fluid. No intraperitoneal free gas. No organized fluid collection. Musculoskeletal: No abdominal wall hernia or abnormality. No suspicious lytic or blastic osseous lesions. No acute displaced fracture. IMPRESSION: 1. Hepatic steatosis and hepatomegaly. 2. Otherwise no acute intra-abdominal or intrapelvic abnormality. Electronically Signed   By: Tish Frederickson M.D.   On: 05/01/2023 02:31    Procedures Procedures    Medications Ordered in ED Medications  ondansetron (ZOFRAN-ODT)  disintegrating tablet 4 mg (4 mg Oral Given 04/30/23 2146)  iohexol (OMNIPAQUE) 350 MG/ML injection 75 mL (75 mLs Intravenous Contrast Given 05/01/23 0224)    ED Course/ Medical Decision Making/ A&P                             Medical Decision Making Amount and/or Complexity of Data Reviewed Labs: ordered.   Differential Diagnosis considered includes, but not limited to: Appendicitis; colitis; diverticulitis; bowel obstruction; cystitis; nephrolithiasis; pyelonephritis; ovarian cyst, ovarian torsion, PID, ectopic pregnancy.  Patient with right-sided and central lower abdominal discomfort.  No right upper quadrant tenderness to suggest gallbladder etiology.  Lab work reassuring.  She did endorse some dysuria but urinalysis does not suggest infection.  Patient underwent CT scan to further evaluate.  No acute abnormality noted including no signs of complications from recent surgery.  Etiology unclear but workup very reassuring, no signs of a edition that requires hospitalization or further emergency  department workup.        Final Clinical Impression(s) / ED Diagnoses Final diagnoses:  Abdominal pain, unspecified abdominal location    Rx / DC Orders ED Discharge Orders     None         Gilda Crease, MD 05/01/23 [phone]

## 2023-05-01 NOTE — Discharge Instructions (Addendum)
Your lab work was normal.  Urinalysis did not show signs of infection.  The CAT scan was also very reassuring, no abnormalities noted including no problems to indicate complications from his recent surgery.
